# Patient Record
Sex: Female | Born: 1965 | Race: Black or African American | Hispanic: No | Marital: Single | Smoking: Never smoker
Health system: Southern US, Community
[De-identification: ages and names within clinical notes are randomized; demographics above are authoritative.]

## PROBLEM LIST (undated history)

## (undated) ENCOUNTER — Emergency Department (HOSPITAL_COMMUNITY): Admission: EM | Payer: No Typology Code available for payment source | Source: Home / Self Care

## (undated) DIAGNOSIS — I10 Essential (primary) hypertension: Secondary | ICD-10-CM

## (undated) HISTORY — PX: CHOLECYSTECTOMY: SHX55

---

## 1998-04-14 ENCOUNTER — Emergency Department (HOSPITAL_COMMUNITY): Admission: EM | Admit: 1998-04-14 | Discharge: 1998-04-14 | Payer: Self-pay | Admitting: Emergency Medicine

## 2001-12-12 ENCOUNTER — Emergency Department (HOSPITAL_COMMUNITY): Admission: EM | Admit: 2001-12-12 | Discharge: 2001-12-12 | Payer: Self-pay | Admitting: *Deleted

## 2002-08-29 ENCOUNTER — Encounter
Admission: RE | Admit: 2002-08-29 | Discharge: 2002-11-27 | Payer: Self-pay | Admitting: Physical Medicine & Rehabilitation

## 2002-09-29 ENCOUNTER — Emergency Department (HOSPITAL_COMMUNITY): Admission: EM | Admit: 2002-09-29 | Discharge: 2002-09-30 | Payer: Self-pay | Admitting: Emergency Medicine

## 2002-11-28 ENCOUNTER — Encounter
Admission: RE | Admit: 2002-11-28 | Discharge: 2003-02-26 | Payer: Self-pay | Admitting: Physical Medicine & Rehabilitation

## 2003-02-07 ENCOUNTER — Encounter: Payer: Self-pay | Admitting: Emergency Medicine

## 2003-02-07 ENCOUNTER — Emergency Department (HOSPITAL_COMMUNITY): Admission: EM | Admit: 2003-02-07 | Discharge: 2003-02-07 | Payer: Self-pay | Admitting: Emergency Medicine

## 2003-04-14 ENCOUNTER — Encounter
Admission: RE | Admit: 2003-04-14 | Discharge: 2003-07-13 | Payer: Self-pay | Admitting: Physical Medicine & Rehabilitation

## 2004-01-11 ENCOUNTER — Other Ambulatory Visit: Admission: RE | Admit: 2004-01-11 | Discharge: 2004-01-11 | Payer: Self-pay | Admitting: Internal Medicine

## 2004-01-18 ENCOUNTER — Emergency Department (HOSPITAL_COMMUNITY): Admission: EM | Admit: 2004-01-18 | Discharge: 2004-01-18 | Payer: Self-pay | Admitting: Emergency Medicine

## 2004-06-30 ENCOUNTER — Emergency Department (HOSPITAL_COMMUNITY): Admission: EM | Admit: 2004-06-30 | Discharge: 2004-06-30 | Payer: Self-pay | Admitting: Emergency Medicine

## 2004-07-17 ENCOUNTER — Inpatient Hospital Stay (HOSPITAL_COMMUNITY): Admission: AD | Admit: 2004-07-17 | Discharge: 2004-07-17 | Payer: Self-pay | Admitting: Internal Medicine

## 2004-07-29 ENCOUNTER — Inpatient Hospital Stay (HOSPITAL_COMMUNITY): Admission: AD | Admit: 2004-07-29 | Discharge: 2004-07-29 | Payer: Self-pay | Admitting: Family Medicine

## 2004-11-11 ENCOUNTER — Encounter: Admission: RE | Admit: 2004-11-11 | Discharge: 2004-12-05 | Payer: Self-pay | Admitting: Occupational Medicine

## 2004-12-27 ENCOUNTER — Encounter: Admission: RE | Admit: 2004-12-27 | Discharge: 2005-01-27 | Payer: Self-pay | Admitting: Occupational Medicine

## 2005-04-01 ENCOUNTER — Encounter: Admission: RE | Admit: 2005-04-01 | Discharge: 2005-04-01 | Payer: Self-pay | Admitting: Occupational Medicine

## 2005-05-02 ENCOUNTER — Emergency Department (HOSPITAL_COMMUNITY): Admission: EM | Admit: 2005-05-02 | Discharge: 2005-05-02 | Payer: Self-pay | Admitting: Emergency Medicine

## 2006-01-30 ENCOUNTER — Inpatient Hospital Stay (HOSPITAL_COMMUNITY): Admission: RE | Admit: 2006-01-30 | Discharge: 2006-02-02 | Payer: Self-pay | Admitting: Obstetrics

## 2006-01-30 ENCOUNTER — Ambulatory Visit: Payer: Self-pay | Admitting: *Deleted

## 2006-02-03 ENCOUNTER — Ambulatory Visit: Payer: Self-pay | Admitting: *Deleted

## 2006-02-06 ENCOUNTER — Ambulatory Visit: Payer: Self-pay | Admitting: Obstetrics & Gynecology

## 2006-02-10 ENCOUNTER — Ambulatory Visit: Payer: Self-pay | Admitting: *Deleted

## 2006-02-13 ENCOUNTER — Inpatient Hospital Stay (HOSPITAL_COMMUNITY): Admission: RE | Admit: 2006-02-13 | Discharge: 2006-02-16 | Payer: Self-pay | Admitting: Obstetrics

## 2006-04-08 ENCOUNTER — Ambulatory Visit (HOSPITAL_COMMUNITY): Admission: RE | Admit: 2006-04-08 | Discharge: 2006-04-08 | Payer: Self-pay | Admitting: Obstetrics

## 2006-04-24 ENCOUNTER — Encounter: Admission: RE | Admit: 2006-04-24 | Discharge: 2006-04-24 | Payer: Self-pay | Admitting: Obstetrics

## 2006-10-20 ENCOUNTER — Emergency Department (HOSPITAL_COMMUNITY): Admission: EM | Admit: 2006-10-20 | Discharge: 2006-10-20 | Payer: Self-pay | Admitting: Emergency Medicine

## 2007-12-31 ENCOUNTER — Other Ambulatory Visit: Admission: RE | Admit: 2007-12-31 | Discharge: 2007-12-31 | Payer: Self-pay | Admitting: Family Medicine

## 2008-05-13 ENCOUNTER — Emergency Department (HOSPITAL_COMMUNITY): Admission: EM | Admit: 2008-05-13 | Discharge: 2008-05-14 | Payer: Self-pay | Admitting: Emergency Medicine

## 2008-06-12 ENCOUNTER — Emergency Department (HOSPITAL_COMMUNITY): Admission: EM | Admit: 2008-06-12 | Discharge: 2008-06-12 | Payer: Self-pay | Admitting: Emergency Medicine

## 2009-03-13 ENCOUNTER — Other Ambulatory Visit: Admission: RE | Admit: 2009-03-13 | Discharge: 2009-03-13 | Payer: Self-pay | Admitting: Family Medicine

## 2009-07-20 ENCOUNTER — Encounter: Admission: RE | Admit: 2009-07-20 | Discharge: 2009-07-20 | Payer: Self-pay | Admitting: Family Medicine

## 2010-01-14 ENCOUNTER — Emergency Department (HOSPITAL_COMMUNITY): Admission: EM | Admit: 2010-01-14 | Discharge: 2010-01-14 | Payer: Self-pay | Admitting: Emergency Medicine

## 2010-03-26 ENCOUNTER — Other Ambulatory Visit: Admission: RE | Admit: 2010-03-26 | Discharge: 2010-03-26 | Payer: Self-pay | Admitting: Family Medicine

## 2011-02-12 ENCOUNTER — Emergency Department (HOSPITAL_COMMUNITY)
Admission: EM | Admit: 2011-02-12 | Discharge: 2011-02-12 | Disposition: A | Discharge status: Home / Self Care | Payer: No Typology Code available for payment source | Attending: Emergency Medicine | Admitting: Emergency Medicine

## 2011-02-12 DIAGNOSIS — I10 Essential (primary) hypertension: Secondary | ICD-10-CM | POA: Insufficient documentation

## 2011-02-12 DIAGNOSIS — M542 Cervicalgia: Secondary | ICD-10-CM | POA: Insufficient documentation

## 2011-02-12 DIAGNOSIS — M79609 Pain in unspecified limb: Secondary | ICD-10-CM | POA: Insufficient documentation

## 2011-03-24 ENCOUNTER — Emergency Department (HOSPITAL_COMMUNITY)
Admission: EM | Admit: 2011-03-24 | Discharge: 2011-03-24 | Disposition: A | Discharge status: Home / Self Care | Payer: No Typology Code available for payment source | Attending: Emergency Medicine | Admitting: Emergency Medicine

## 2011-03-24 ENCOUNTER — Emergency Department (HOSPITAL_COMMUNITY): Payer: No Typology Code available for payment source

## 2011-03-24 DIAGNOSIS — Y9289 Other specified places as the place of occurrence of the external cause: Secondary | ICD-10-CM | POA: Insufficient documentation

## 2011-03-24 DIAGNOSIS — M79609 Pain in unspecified limb: Secondary | ICD-10-CM | POA: Insufficient documentation

## 2011-03-24 DIAGNOSIS — I10 Essential (primary) hypertension: Secondary | ICD-10-CM | POA: Insufficient documentation

## 2011-03-26 ENCOUNTER — Emergency Department (HOSPITAL_COMMUNITY)
Admission: EM | Admit: 2011-03-26 | Discharge: 2011-03-26 | Disposition: A | Discharge status: Home / Self Care | Payer: No Typology Code available for payment source | Attending: Emergency Medicine | Admitting: Emergency Medicine

## 2011-03-26 DIAGNOSIS — I10 Essential (primary) hypertension: Secondary | ICD-10-CM | POA: Insufficient documentation

## 2011-03-26 DIAGNOSIS — M545 Low back pain, unspecified: Secondary | ICD-10-CM | POA: Insufficient documentation

## 2011-03-26 DIAGNOSIS — M79609 Pain in unspecified limb: Secondary | ICD-10-CM | POA: Insufficient documentation

## 2011-03-26 DIAGNOSIS — IMO0002 Reserved for concepts with insufficient information to code with codable children: Secondary | ICD-10-CM | POA: Insufficient documentation

## 2011-05-02 NOTE — Discharge Summary (Signed)
NAME:  LAVERA, VANDERMEER             ACCOUNT NO.:  [id number]   MEDICAL RECORD NO.:  [id number]          PATIENT TYPE:  INP   LOCATION:  9118                          FACILITY:  [facility]   PHYSICIAN:  Kathreen Cosier, M.D.DATE OF BIRTH:  09/09/1966   DATE OF ADMISSION:  02/13/2006  DATE OF DISCHARGE:  02/16/2006                                 DISCHARGE SUMMARY   Patient is a 45 year old gravida 5, para 2-1-1-3, [facility] February 14, 2006 to February 24, 2006.  Previous cesarean section in the past and patient desired repeat  cesarean section.  She also had a history of hypertension and was on Norvasc  10 mg p.o. daily.  She was followed with nonstress tests twice a week and  she had a positive GBS.  On admission she had a repeat low transverse  cesarean section of 7 pound 2 ounce female, Apgars 9 and 9.  Postoperatively  she did well.  Her hemoglobin on admission was 11.8, postoperative 9.5,  platelets 234 and 208.  PT/PTT normal.  Electrolytes normal.  Patient  discharged home on the third postoperative day, ambulatory, on a regular  diet to see me in six weeks.   DISCHARGE DIAGNOSES:  Status post repeat low transverse cesarean section at  term.           ______________________________  Kathreen Cosier, M.D.     BAM/MEDQ  D:  03/11/2006  T:  03/11/2006  Job:  [id number]

## 2011-05-02 NOTE — Op Note (Signed)
NAME:  Kendra Webb, Kendra Webb             ACCOUNT NO.:  [id number]   MEDICAL RECORD NO.:  [id number]          PATIENT TYPE:  INP   LOCATION:  9118                          FACILITY:  [facility]   PHYSICIAN:  Kathreen Cosier, M.D.DATE OF BIRTH:  December 02, 1966   DATE OF PROCEDURE:  02/13/2006  DATE OF DISCHARGE:                                 OPERATIVE REPORT   PREOPERATIVE DIAGNOSES:  1.  Previous cesarean section.  2.  At term, desires repeat.   SURGEON:  Kathreen Cosier, M.D.   ANESTHESIA:  Spinal.   DESCRIPTION OF PROCEDURE:  After spinal, was placed in the supine position  on the operating room table.  Transverse suprapubic incision made through  the old scar, carried down through the rectus fascia.  The fascia was  cleaned and incised the length of the incision.  Rectus muscle were  retracted laterally.  The peritoneum was incised longitudinally.  Transverse  incision made in the vesicouterine peritoneum above the bladder.  The  bladder flap mobilized inferiorly.  Transverse lower uterine incision made.  The patient was delivered from the OP position of a female, Apgars 9 and 9,  weight 7 pounds 1 ounce.  The team was in attendance.  Fluid was clear.  Placenta was anterior and removed manually.  Uterine cavity was cleaned with  dry laps.  Uterine incision closed in one layer with continuous suture of #1  chromic.  Hemostasis was satisfactory.  Bladder flap reattached with 2-0  chromic.  Uterus was well contracted.  Tubes and ovaries normal.  Abdomen  closed in layers.  Peritoneum with continuous suture of 0 chromic, fascia  with continuous suture of 0 Dexon and skin closed with subcuticular stitch  of 4-0 Monocryl.  Blood loss 500 mL.           ______________________________  Kathreen Cosier, M.D.     BAM/MEDQ  D:  02/13/2006  T:  02/14/2006  Job:  [id number]

## 2011-09-11 LAB — COMPREHENSIVE METABOLIC PANEL
Albumin: 4.1
Alkaline Phosphatase: 51
Calcium: 9.6
Chloride: 103
Creatinine, Ser: 0.78
GFR calc Af Amer: >60
Sodium: 138
Total Bilirubin: 1
Total Protein: 7.5

## 2011-09-11 LAB — CBC
Hemoglobin: 12.8
RDW: 12.9
WBC: 5.3

## 2011-09-11 LAB — DIFFERENTIAL
Lymphs Abs: 1.4
Monocytes Absolute: 0.4
Neutro Abs: 3.5
Neutrophils Relative %: 66

## 2011-09-11 LAB — POCT CARDIAC MARKERS
Myoglobin, poc: 52.5
Operator id: 231701

## 2013-05-19 ENCOUNTER — Emergency Department (INDEPENDENT_AMBULATORY_CARE_PROVIDER_SITE_OTHER)
Admission: EM | Admit: 2013-05-19 | Discharge: 2013-05-19 | Disposition: A | Discharge status: Home / Self Care | Payer: Self-pay | Source: Home / Self Care | Attending: Family Medicine | Admitting: Family Medicine

## 2013-05-19 ENCOUNTER — Encounter (HOSPITAL_COMMUNITY): Payer: Self-pay | Admitting: Emergency Medicine

## 2013-05-19 ENCOUNTER — Ambulatory Visit: Payer: Managed Care, Other (non HMO)

## 2013-05-19 ENCOUNTER — Emergency Department (INDEPENDENT_AMBULATORY_CARE_PROVIDER_SITE_OTHER): Payer: Self-pay

## 2013-05-19 DIAGNOSIS — M25461 Effusion, right knee: Secondary | ICD-10-CM

## 2013-05-19 DIAGNOSIS — M25469 Effusion, unspecified knee: Secondary | ICD-10-CM

## 2013-05-19 HISTORY — DX: Essential (primary) hypertension: I10

## 2013-05-19 MED ORDER — CELECOXIB 100 MG PO CAPS: 100.0000 mg | ORAL_CAPSULE | Freq: Two times a day (BID) | ORAL | Status: DC

## 2013-05-19 MED ORDER — TRAMADOL HCL 50 MG PO TABS: 50.0000 mg | ORAL_TABLET | Freq: Three times a day (TID) | ORAL | Status: DC | PRN

## 2013-05-19 NOTE — ED Notes (Signed)
Pt c/o right knee inj onset Tuesday Reports she was repositioning a pt that was already on her bed Felt something "pulling"... Denies: swelling She is alert and oriented and ambulated well to exam room

## 2013-05-21 NOTE — ED Provider Notes (Signed)
History     CSN: [id number]  Arrival date & time 05/19/13  1842   First MD Initiated Contact with Patient 05/19/13 2012      Chief Complaint  Patient presents with  . Knee Injury    (Consider location/radiation/quality/duration/timing/severity/associated sxs/prior treatment) HPI Comments: 47 y/o female here c/o right knee pain and swelling for 3 days. 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 Patient states she was lifting a patient that was laying in bed while she was standing from behind the head board her right knee was in contact with the headboard during this process when she felt a sudden painful "pop" followed by pulling sensation. She has continued to bear weight on the right leg with worsening discomfort in her right knee during walking today swelling worse and is painful to bend her knee.    Past Medical History  Diagnosis Date  . Hypertension     Past Surgical History  Procedure Laterality Date  . Cholecystectomy      No family history on file.  History  Substance Use Topics  . Smoking status: Never Smoker   . Smokeless tobacco: Not on file  . Alcohol Use: No    OB History   Grav Para Term Preterm Abortions TAB SAB Ect Mult Living                  Review of Systems  Constitutional: Negative for fever and chills.  HENT: Negative for sore throat.   Gastrointestinal: Negative for nausea.  Musculoskeletal:       Right knee pain as per HPI  Skin: Negative for rash and wound.  All other systems reviewed and are negative.    Allergies  Hydrocodone-acetaminophen; Latex; and Pantoprazole sodium  Home Medications   Current Outpatient Rx  Name  Route  Sig  Dispense  Refill  . celecoxib (CELEBREX) 100 MG capsule   Oral   Take 1 capsule (100 mg total) by mouth 2 (two) times daily.   20 capsule   0   . LISINOPRIL PO   Oral   Take by mouth.         . traMADol  (ULTRAM) 50 MG tablet   Oral   Take 1 tablet (50 mg total) by mouth every 8 (eight) hours as needed for pain.   20 tablet   0     BP 135/76  Pulse 51  Temp(Src) 98 F (36.7 C) (Oral)  Resp 16  SpO2 100%  LMP 04/29/2013  Physical Exam  Nursing note and vitals reviewed. Constitutional: She is oriented to person, place, and time. She appears well-developed and well-nourished. No distress.  HENT:  Head: Normocephalic and atraumatic.  Mouth/Throat: No oropharyngeal exudate.  Cardiovascular: Normal heart sounds.   Pulmonary/Chest: Breath sounds normal.  Musculoskeletal:  Right knee: mild swelling otherwise no obvious deformity. No erythema or increased temperature. Tenderness with palpation over patella worse lateral to the patella. Patella appears well aligned. There is moderate knee effusion. No crepitus. Limited flexion due to pain and swelling. Negative drawer test. Weight bearing. Entire right lower leg appears neurovascularly intact.  Lymphadenopathy:    She has no cervical adenopathy.  Neurological: She is alert and oriented to person, place, and time.  Skin: She is not diaphoretic.    ED Course  Procedures (including critical care time)  Labs Reviewed - No data to display Dg Knee Complete 4  Views Right  05/19/2013   *RADIOLOGY REPORT*  Clinical Data: Knee pain.  Knee injury.  RIGHT KNEE - COMPLETE 4+ VIEW  Comparison: None.  Findings: Small to moderate effusion is present.  Joint spaces are preserved.  No fracture.  Anatomic alignment.  IMPRESSION: Small to moderate effusion without osseous injury.   Original Report Authenticated By: Andreas Newport, M.D.     1. Knee effusion, right       MDM  Placed on knee immobilizer and crutches.  Prescribed celebrex and tramadol.  Orthopedic referral.  Patient also will follow up with occupational health.        Sharin Grave, MD 05/21/13 7829

## 2016-02-25 ENCOUNTER — Emergency Department (HOSPITAL_COMMUNITY)
Admission: EM | Admit: 2016-02-25 | Discharge: 2016-02-26 | Disposition: A | Discharge status: Home / Self Care | Payer: No Typology Code available for payment source | Attending: Emergency Medicine | Admitting: Emergency Medicine

## 2016-02-25 ENCOUNTER — Encounter (HOSPITAL_COMMUNITY): Payer: Self-pay | Admitting: Emergency Medicine

## 2016-02-25 ENCOUNTER — Emergency Department (HOSPITAL_COMMUNITY): Payer: No Typology Code available for payment source

## 2016-02-25 DIAGNOSIS — S29002A Unspecified injury of muscle and tendon of back wall of thorax, initial encounter: Secondary | ICD-10-CM | POA: Diagnosis not present

## 2016-02-25 DIAGNOSIS — I10 Essential (primary) hypertension: Secondary | ICD-10-CM | POA: Insufficient documentation

## 2016-02-25 DIAGNOSIS — Z79899 Other long term (current) drug therapy: Secondary | ICD-10-CM | POA: Insufficient documentation

## 2016-02-25 DIAGNOSIS — Z7982 Long term (current) use of aspirin: Secondary | ICD-10-CM | POA: Diagnosis not present

## 2016-02-25 DIAGNOSIS — S199XXA Unspecified injury of neck, initial encounter: Secondary | ICD-10-CM | POA: Insufficient documentation

## 2016-02-25 DIAGNOSIS — M546 Pain in thoracic spine: Secondary | ICD-10-CM

## 2016-02-25 DIAGNOSIS — S8012XA Contusion of left lower leg, initial encounter: Secondary | ICD-10-CM | POA: Diagnosis not present

## 2016-02-25 DIAGNOSIS — S0990XA Unspecified injury of head, initial encounter: Secondary | ICD-10-CM | POA: Insufficient documentation

## 2016-02-25 DIAGNOSIS — Y9389 Activity, other specified: Secondary | ICD-10-CM | POA: Diagnosis not present

## 2016-02-25 DIAGNOSIS — S8011XA Contusion of right lower leg, initial encounter: Secondary | ICD-10-CM | POA: Diagnosis not present

## 2016-02-25 DIAGNOSIS — Y92410 Unspecified street and highway as the place of occurrence of the external cause: Secondary | ICD-10-CM | POA: Insufficient documentation

## 2016-02-25 DIAGNOSIS — S8992XA Unspecified injury of left lower leg, initial encounter: Secondary | ICD-10-CM | POA: Diagnosis present

## 2016-02-25 DIAGNOSIS — Y998 Other external cause status: Secondary | ICD-10-CM | POA: Diagnosis not present

## 2016-02-25 DIAGNOSIS — Z91040 Latex allergy status: Secondary | ICD-10-CM | POA: Insufficient documentation

## 2016-02-25 LAB — CBC
HEMATOCRIT: 38.8 % (ref 36.0–46.0)
HEMOGLOBIN: 12.8 g/dL (ref 12.0–15.0)
MCH: 29.7 pg (ref 26.0–34.0)
MCHC: 33 g/dL (ref 30.0–36.0)
MCV: 90 fL (ref 78.0–100.0)
Platelets: 291 10*3/uL (ref 150–400)
RBC: 4.31 MIL/uL (ref 3.87–5.11)
RDW: 12.3 % (ref 11.5–15.5)
WBC: 8.1 10*3/uL (ref 4.0–10.5)

## 2016-02-25 LAB — I-STAT TROPONIN, ED: Troponin i, poc: 0 ng/mL (ref 0.00–0.08)

## 2016-02-25 LAB — BASIC METABOLIC PANEL
Anion gap: 8 (ref 5–15)
BUN: 16 mg/dL (ref 6–20)
CALCIUM: 9.9 mg/dL (ref 8.9–10.3)
CHLORIDE: 107 mmol/L (ref 101–111)
CO2: 27 mmol/L (ref 22–32)
CREATININE: 0.96 mg/dL (ref 0.44–1.00)
GFR calc Af Amer: >60 mL/min (ref >60)
GLUCOSE: 99 mg/dL (ref 65–99)
Potassium: 4.1 mmol/L (ref 3.5–5.1)
Sodium: 142 mmol/L (ref 135–145)

## 2016-02-25 MED ORDER — OXYCODONE-ACETAMINOPHEN 5-325 MG PO TABS
1.0000 | ORAL_TABLET | Freq: Once | ORAL | Status: DC
  Filled 2016-02-25: qty 1

## 2016-02-25 MED ORDER — ONDANSETRON 8 MG PO TBDP
8.0000 mg | ORAL_TABLET | Freq: Once | ORAL | Status: DC
  Filled 2016-02-25: qty 1

## 2016-02-25 MED ORDER — NAPROXEN 500 MG PO TABS: 500.0000 mg | ORAL_TABLET | Freq: Two times a day (BID) | ORAL | Status: DC

## 2016-02-25 MED ORDER — METHOCARBAMOL 500 MG PO TABS: 500.0000 mg | ORAL_TABLET | Freq: Two times a day (BID) | ORAL | Status: DC

## 2016-02-25 MED ORDER — TRAMADOL HCL 50 MG PO TABS: 50.0000 mg | ORAL_TABLET | Freq: Four times a day (QID) | ORAL | Status: AC | PRN

## 2016-02-25 MED ORDER — IBUPROFEN 800 MG PO TABS
800.0000 mg | ORAL_TABLET | Freq: Once | ORAL | Status: AC
  Administered 2016-02-25: 800 mg via ORAL
  Filled 2016-02-25: qty 1
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
## 2016-02-25 NOTE — ED Notes (Signed)
Patient transported to X-ray 

## 2016-02-25 NOTE — ED Notes (Signed)
CALL PATIENT NAME SEVERAL TIME TO COLLECT LABS AND NO ANSWER
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
## 2016-05-10 ENCOUNTER — Encounter (HOSPITAL_COMMUNITY): Payer: Self-pay | Admitting: *Deleted

## 2016-05-10 ENCOUNTER — Emergency Department (HOSPITAL_COMMUNITY)
Admission: EM | Admit: 2016-05-10 | Discharge: 2016-05-10 | Disposition: A | Discharge status: Home / Self Care | Payer: Worker's Compensation | Attending: Emergency Medicine | Admitting: Emergency Medicine

## 2016-05-10 ENCOUNTER — Emergency Department (HOSPITAL_COMMUNITY): Payer: Worker's Compensation

## 2016-05-10 DIAGNOSIS — Z91040 Latex allergy status: Secondary | ICD-10-CM | POA: Diagnosis not present

## 2016-05-10 DIAGNOSIS — S63502A Unspecified sprain of left wrist, initial encounter: Secondary | ICD-10-CM | POA: Diagnosis not present

## 2016-05-10 DIAGNOSIS — I10 Essential (primary) hypertension: Secondary | ICD-10-CM | POA: Insufficient documentation

## 2016-05-10 DIAGNOSIS — Z7982 Long term (current) use of aspirin: Secondary | ICD-10-CM | POA: Diagnosis not present

## 2016-05-10 DIAGNOSIS — Z791 Long term (current) use of non-steroidal anti-inflammatories (NSAID): Secondary | ICD-10-CM | POA: Diagnosis not present

## 2016-05-10 DIAGNOSIS — Y990 Civilian activity done for income or pay: Secondary | ICD-10-CM | POA: Diagnosis not present

## 2016-05-10 DIAGNOSIS — X58XXXA Exposure to other specified factors, initial encounter: Secondary | ICD-10-CM | POA: Diagnosis not present

## 2016-05-10 DIAGNOSIS — Y93F2 Activity, caregiving, lifting: Secondary | ICD-10-CM | POA: Diagnosis not present

## 2016-05-10 DIAGNOSIS — Z79899 Other long term (current) drug therapy: Secondary | ICD-10-CM | POA: Diagnosis not present

## 2016-05-10 DIAGNOSIS — Y929 Unspecified place or not applicable: Secondary | ICD-10-CM | POA: Insufficient documentation

## 2016-05-10 DIAGNOSIS — S6992XA Unspecified injury of left wrist, hand and finger(s), initial encounter: Secondary | ICD-10-CM | POA: Diagnosis present

## 2016-05-10 MED ORDER — IBUPROFEN 600 MG PO TABS: 600.0000 mg | ORAL_TABLET | Freq: Four times a day (QID) | ORAL | Status: DC | PRN

## 2016-05-10 NOTE — ED Provider Notes (Signed)
CSN: [id number]     Arrival date & time 05/10/16  0848 History  By signing my name below, I, Kendra Webb, attest that this documentation has been prepared under the direction and in the presence of Langston MaskerKaren Reford Olliff, [place]. Electronically Signed: Phillis HaggisGabriella Webb, ED Scribe. 05/10/2016. 9:16 AM.   Chief Complaint  Patient presents with  . Wrist Pain   The history is provided by the patient. No language interpreter was used.  HPI Comments: Eichholz CheRachel R Webb is a 50 y.o. Female with a hx of HTN who presents to the Emergency Department complaining of left wrist pain onset one day ago. Pt reports hurting her wrist after assisting a pt who had passed out on the floor. She reports worsening pain with movement, but states pain is constant even at rest. The pain radiates up to her mid-forearm. She has not taken anything for her pain. She denies numbness, weakness, or joint swelling. Pt states she cannot take Vicodin.   Past Medical History  Diagnosis Date  . Hypertension    Past Surgical History  Procedure Laterality Date  . Cholecystectomy     History reviewed. No pertinent family history. Social History  Substance Use Topics  . Smoking status: Never Smoker   . Smokeless tobacco: None  . Alcohol Use: No   OB History    No data available     Review of Systems  Musculoskeletal: Positive for arthralgias. Negative for joint swelling.  Neurological: Negative for weakness and numbness.  All other systems reviewed and are negative.  Allergies  Hydrocodone-acetaminophen; Latex; and Pantoprazole sodium  Home Medications   Prior to Admission medications   Medication Sig Start Date End Date Taking? Authorizing Provider  aspirin 325 MG tablet Take 325 mg by mouth every 6 (six) hours as needed for mild pain, moderate pain or headache.    Historical Provider, MD  lisinopril (PRINIVIL,ZESTRIL) 10 MG tablet Take 10 mg by mouth daily.    Historical Provider, MD  methocarbamol (ROBAXIN) 500 MG tablet  Take 1 tablet (500 mg total) by mouth 2 (two) times daily. 02/25/16   Tatyana Kirichenko, PA-C  naproxen (NAPROSYN) 500 MG tablet Take 1 tablet (500 mg total) by mouth 2 (two) times daily. 02/25/16   Tatyana Kirichenko, PA-C  traMADol (ULTRAM) 50 MG tablet Take 1 tablet (50 mg total) by mouth every 6 (six) hours as needed. 02/25/16   Tatyana Kirichenko, PA-C   BP 113/73 mmHg  Pulse 62  Temp(Src) 98.6 F (37 C) (Oral)  Resp 16  Ht 5\' 4"  (1.626 m)  Wt 150 lb (68.04 kg)  BMI 25.73 kg/m2  SpO2 99%  LMP 04/29/2013 Physical Exam  Constitutional: She is oriented to person, place, and time. She appears well-developed and well-nourished.  HENT:  Head: Normocephalic and atraumatic.  Eyes: Conjunctivae are normal.  Neck: Normal range of motion. Neck supple.  Cardiovascular: Normal rate and regular rhythm.   Pulmonary/Chest: Effort normal and breath sounds normal.  Musculoskeletal: Normal range of motion. She exhibits tenderness.  Left wrist: tenderness diffusely; full ROM  Neurological: She is alert and oriented to person, place, and time.  Skin: Skin is warm and dry.  Psychiatric: She has a normal mood and affect. Her behavior is normal.  Nursing note and vitals reviewed.  ED Course  Procedures (including critical care time) DIAGNOSTIC STUDIES: Oxygen Saturation is 99% on RA, normal by my interpretation.    COORDINATION OF CARE: 9:11 AM-Discussed treatment plan which includes x-ray with pt at bedside and pt  agreed to plan.    Labs Review Labs Reviewed - No data to display  Imaging Review Dg Wrist Complete Left  05/10/2016  CLINICAL DATA:  Acute left wrist pain after injury at work. Initial encounter. EXAM: LEFT WRIST - COMPLETE 3+ VIEW COMPARISON:  None. FINDINGS: There is no evidence of fracture or dislocation. Narrowing and osteophyte formation is seen involving the first carpometacarpal joint. Soft tissues are unremarkable. IMPRESSION: Osteoarthritis of the first carpometacarpal  joint. No acute abnormality seen in the left wrist. Electronically Signed   By: Lupita Raider, M.D.   On: 05/10/2016 10:00   I have personally reviewed and evaluated these images and lab results as part of my medical decision-making.   EKG Interpretation None      MDM   Final diagnoses:  Wrist sprain, left, initial encounter   Wrist sprain An After Visit Summary was printed and given to the patient.  Kendra Areas, PA-C 05/10/16 1015  [address] [place], PA-C 05/10/16 1015  Tilden Fossa, MD 05/11/16 [phone]

## 2016-05-10 NOTE — Discharge Instructions (Signed)
Wrist Pain There are many things that can cause wrist pain. Some common causes include:  An injury to the wrist area, such as a sprain, strain, or fracture.  Overuse of the joint.  A condition that causes increased pressure on a nerve in the wrist (carpal tunnel syndrome).  Wear and tear of the joints that occurs with aging (osteoarthritis).  A variety of other types of arthritis. Sometimes, the cause of wrist pain is not known. The pain often goes away when you follow your health care provider's instructions for relieving pain at home. If your wrist pain continues, tests may need to be done to diagnose your condition. HOME CARE INSTRUCTIONS Pay attention to any changes in your symptoms. Take these actions to help with your pain:  Rest the wrist area for at least 48 hours or as told by your health care provider.  If directed, apply ice to the injured area:  Put ice in a plastic bag.  Place a towel between your skin and the bag.  Leave the ice on for 20 minutes, 2-3 times per day.  Keep your arm raised (elevated) above the level of your heart while you are sitting or lying down.  If a splint or elastic bandage has been applied, use it as told by your health care provider.  Remove the splint or bandage only as told by your health care provider.  Loosen the splint or bandage if your fingers become numb or have a tingling feeling, or if they turn cold or blue.  Take over-the-counter and prescription medicines only as told by your health care provider.  Keep all follow-up visits as told by your health care provider. This is important. SEEK MEDICAL CARE IF:  Your pain is not helped by treatment.  Your pain gets worse. SEEK IMMEDIATE MEDICAL CARE IF:  Your fingers become swollen.  Your fingers turn white, very red, or cold and blue.  Your fingers are numb or have a tingling feeling.  You have difficulty moving your fingers.   This information is not intended to replace  advice given to you by your health care provider. Make sure you discuss any questions you have with your health care provider.   Document Released: 09/10/2005 Document Revised: 08/22/2015 Document Reviewed: 04/18/2015 Elsevier Interactive Patient Education 2016 Elsevier Inc.  

## 2016-05-10 NOTE — ED Notes (Signed)
Declined W/C at D/C and was escorted to lobby by RN. 

## 2016-05-10 NOTE — ED Notes (Signed)
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 Pt reports that she had to assist her Pt to the floor after passing out.

## 2017-08-17 ENCOUNTER — Encounter (HOSPITAL_COMMUNITY): Payer: Self-pay | Admitting: Emergency Medicine

## 2017-08-17 ENCOUNTER — Emergency Department (HOSPITAL_COMMUNITY)
Admission: EM | Admit: 2017-08-17 | Discharge: 2017-08-17 | Disposition: A | Discharge status: Home / Self Care | Payer: PRIVATE HEALTH INSURANCE | Attending: Emergency Medicine | Admitting: Emergency Medicine

## 2017-08-17 DIAGNOSIS — I160 Hypertensive urgency: Secondary | ICD-10-CM | POA: Insufficient documentation

## 2017-08-17 DIAGNOSIS — Z79899 Other long term (current) drug therapy: Secondary | ICD-10-CM | POA: Diagnosis not present

## 2017-08-17 DIAGNOSIS — Z7982 Long term (current) use of aspirin: Secondary | ICD-10-CM | POA: Insufficient documentation

## 2017-08-17 DIAGNOSIS — Z91040 Latex allergy status: Secondary | ICD-10-CM | POA: Insufficient documentation

## 2017-08-17 DIAGNOSIS — R42 Dizziness and giddiness: Secondary | ICD-10-CM | POA: Diagnosis present

## 2017-08-17 LAB — CBC WITH DIFFERENTIAL/PLATELET
BASOS ABS: 0 10*3/uL (ref 0.0–0.1)
BASOS PCT: 0 %
Eosinophils Absolute: 0 10*3/uL (ref 0.0–0.7)
Eosinophils Relative: 1 %
HEMATOCRIT: 38 % (ref 36.0–46.0)
HEMOGLOBIN: 12.5 g/dL (ref 12.0–15.0)
LYMPHS PCT: 31 %
Lymphs Abs: 1.6 10*3/uL (ref 0.7–4.0)
MCH: 28.5 pg (ref 26.0–34.0)
MCHC: 32.9 g/dL (ref 30.0–36.0)
MCV: 86.8 fL (ref 78.0–100.0)
MONO ABS: 0.4 10*3/uL (ref 0.1–1.0)
Monocytes Relative: 8 %
NEUTROS ABS: 3.1 10*3/uL (ref 1.7–7.7)
NEUTROS PCT: 60 %
Platelets: 255 10*3/uL (ref 150–400)
RBC: 4.38 MIL/uL (ref 3.87–5.11)
RDW: 12.2 % (ref 11.5–15.5)
WBC: 5.1 10*3/uL (ref 4.0–10.5)

## 2017-08-17 LAB — BASIC METABOLIC PANEL
ANION GAP: 8 (ref 5–15)
BUN: 12 mg/dL (ref 6–20)
CALCIUM: 9.2 mg/dL (ref 8.9–10.3)
CO2: 23 mmol/L (ref 22–32)
Chloride: 107 mmol/L (ref 101–111)
Creatinine, Ser: 0.77 mg/dL (ref 0.44–1.00)
GLUCOSE: 109 mg/dL — AB (ref 65–99)
POTASSIUM: 3.8 mmol/L (ref 3.5–5.1)
Sodium: 138 mmol/L (ref 135–145)

## 2017-08-17 LAB — URINALYSIS, ROUTINE W REFLEX MICROSCOPIC
BACTERIA UA: NONE SEEN
BILIRUBIN URINE: NEGATIVE
Glucose, UA: NEGATIVE mg/dL
KETONES UR: NEGATIVE mg/dL
LEUKOCYTES UA: NEGATIVE
NITRITE: NEGATIVE
PROTEIN: NEGATIVE mg/dL
Specific Gravity, Urine: 1.011 (ref 1.005–1.030)
pH: 5 (ref 5.0–8.0)

## 2017-08-17 MED ORDER — AMLODIPINE BESY-BENAZEPRIL HCL 10-20 MG PO CAPS: 1.0000 | ORAL_CAPSULE | Freq: Every day | ORAL | 1 refills | Status: AC

## 2017-08-17 NOTE — ED Notes (Signed)
Pt present today feeling bad secondary to her blood pressure medicines being changed 2 months ago.

## 2017-08-17 NOTE — ED Triage Notes (Signed)
Reports that physician changed blood pressure around.  States it isn't working as well and it makes me feel really bad.  Reporting a headache and neck stiffness at times.  C/o ha at this time.

## 2017-08-17 NOTE — ED Provider Notes (Signed)
[facility]-EMERGENCY DEPT Provider Note   CSN: [id number] Arrival date & time: 08/17/17  0500     History   Chief Complaint Chief Complaint  Patient presents with  . wants bp med changed    HPI Kendra Webb is a 51 y.o. female.  Patient is a 51 year old female with past medical history of hypertension. She presents for evaluation of elevated blood pressure, intermittent dizziness, and not feeling well for the past several weeks. She was initially taking lisinopril, then this was changed to Lotrel. During her last medication renewal, she was put back on lisinopril for unknown reasons. The patient feels as though this was likely a mistake and blames this medication for her not feeling well. Eyes any chest pain or shortness of breath. She denies any fevers or chills.   The history is provided by the patient.    Past Medical History:  Diagnosis Date  . Hypertension     There are no active problems to display for this patient.   Past Surgical History:  Procedure Laterality Date  . CHOLECYSTECTOMY      OB History    No data available       Home Medications    Prior to Admission medications   Medication Sig Start Date End Date Taking? Authorizing Provider  aspirin 325 MG tablet Take 325 mg by mouth every 6 (six) hours as needed for mild pain, moderate pain or headache.    [provider]  ibuprofen (ADVIL,MOTRIN) 600 MG tablet Take 1 tablet (600 mg total) by mouth every 6 (six) hours as needed. 05/10/16   Elson AreasSofia, Leslie K, PA-C  lisinopril (PRINIVIL,ZESTRIL) 10 MG tablet Take 10 mg by mouth daily.    [provider]  methocarbamol (ROBAXIN) 500 MG tablet Take 1 tablet (500 mg total) by mouth 2 (two) times daily. 02/25/16   Kirichenko, Tatyana, PA-C  naproxen (NAPROSYN) 500 MG tablet Take 1 tablet (500 mg total) by mouth 2 (two) times daily. 02/25/16   Kirichenko, Tatyana, PA-C  traMADol (ULTRAM) 50 MG tablet Take 1 tablet (50 mg total) by mouth every 6 (six)  hours as needed. 02/25/16   Jaynie CrumbleKirichenko, Tatyana, PA-C    Family History No family history on file.  Social History Social History  Substance Use Topics  . Smoking status: Never Smoker  . Smokeless tobacco: Never Used  . Alcohol use No     Allergies   Hydrocodone-acetaminophen; Latex; and Pantoprazole sodium   Review of Systems Review of Systems  All other systems reviewed and are negative.    Physical Exam Updated Vital Signs BP (!) 163/99 (BP Location: Right Arm)   Pulse (!) 52   Temp 97.9 F (36.6 C) (Oral)   Resp 18   LMP 04/29/2013   SpO2 100%   Physical Exam  Constitutional: She is oriented to person, place, and time. She appears well-developed and well-nourished. No distress.  HENT:  Head: Normocephalic and atraumatic.  Eyes: Pupils are equal, round, and reactive to light. EOM are normal.  Neck: Normal range of motion. Neck supple.  Cardiovascular: Normal rate and regular rhythm.  Exam reveals no gallop and no friction rub.   No murmur heard. Pulmonary/Chest: Effort normal and breath sounds normal. No respiratory distress. She has no wheezes.  Abdominal: Soft. Bowel sounds are normal. She exhibits no distension. There is no tenderness.  Musculoskeletal: Normal range of motion.  Neurological: She is alert and oriented to person, place, and time. No cranial nerve deficit. She exhibits normal  muscle tone. Coordination normal.  Skin: Skin is warm and dry. She is not diaphoretic.  Nursing note and vitals reviewed.    ED Treatments / Results  Labs (all labs ordered are listed, but only abnormal results are displayed) Labs Reviewed  BASIC METABOLIC PANEL  CBC WITH DIFFERENTIAL/PLATELET  URINALYSIS, ROUTINE W REFLEX MICROSCOPIC    EKG  EKG Interpretation  Date/Time:  Monday August 17 2017 06:08:42 EDT Ventricular Rate:  59 PR Interval:    QRS Duration: 86 QT Interval:  431 QTC Calculation: 427 R Axis:   73 Text Interpretation:  Sinus rhythm  Prolonged PR interval RSR' in V1 or V2, right VCD or RVH Confirmed by Geoffery Lyons ([id number]) on 08/17/2017 6:51:49 AM       Radiology No results found.  Procedures Procedures (including critical care time)  Medications Ordered in ED Medications - No data to display   Initial Impression / Assessment and Plan / ED Course  I have reviewed the triage vital signs and the nursing notes.  Pertinent labs & imaging results that were available during my care of the patient were reviewed by me and considered in my medical decision making (see chart for details).  Patient presents with elevated blood pressure and not feeling well since her blood pressure medication was changed from Lotrel to lisinopril. Her blood pressure is elevated here, however not to an emergent level.  Laboratory studies are reassuring, EKG is unchanged, and urinalysis is clear. She will be discharged, to return as needed for any problems.  Final Clinical Impressions(s) / ED Diagnoses   Final diagnoses:  None    New Prescriptions New Prescriptions   No medications on file     Geoffery Lyons, MD 08/17/17 [phone]

## 2017-08-17 NOTE — Discharge Instructions (Signed)
Stop taking lisinopril.  Begin taking Lotrel as prescribed.  Follow-up with your primary Dr. for a blood pressure check in the next week, and return to the ER if symptoms significantly worsen or change in the meantime.

## 2017-10-01 IMAGING — DX DG WRIST COMPLETE 3+V*L*
4 series · 4 of 4 positions shown · non-contrast
Comparison: None.

CLINICAL DATA: Acute left wrist pain after injury at work. Initial
encounter.

EXAM:
LEFT WRIST - COMPLETE 3+ VIEW

[wrist pa]
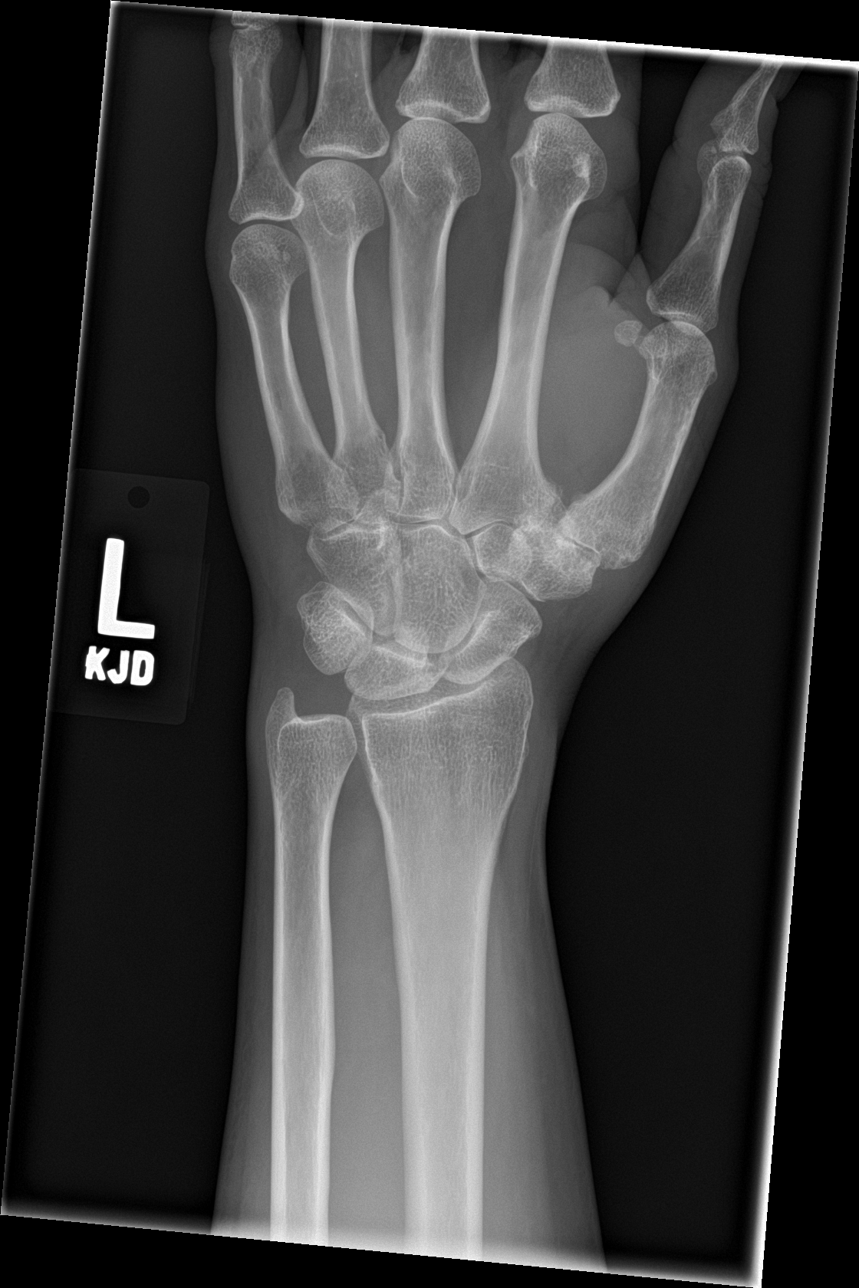

[wrist obl]
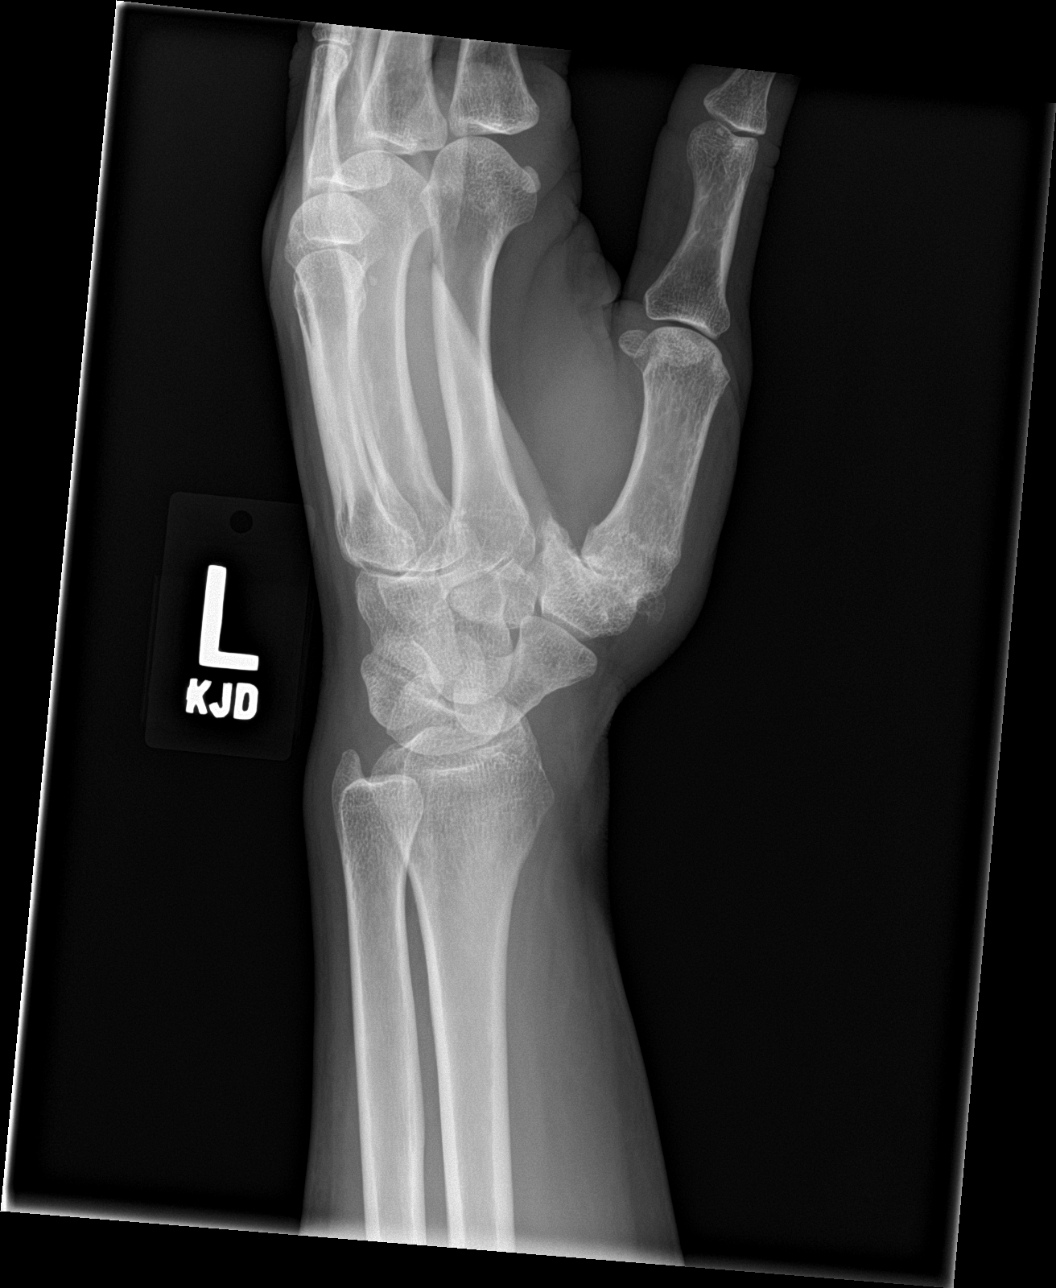

[wrist lat]
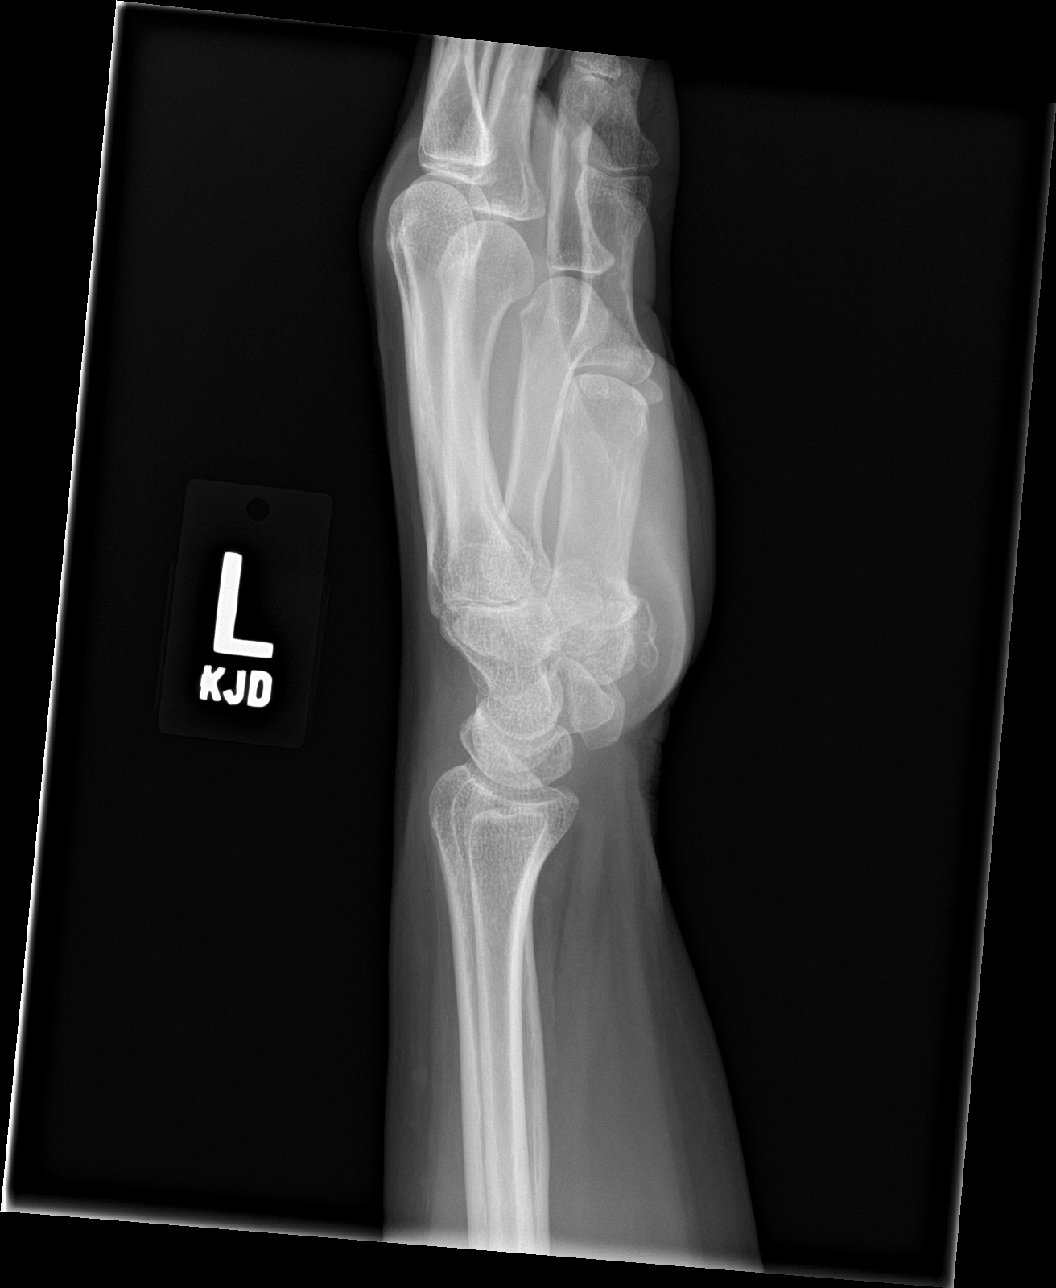

[wrist navicular]
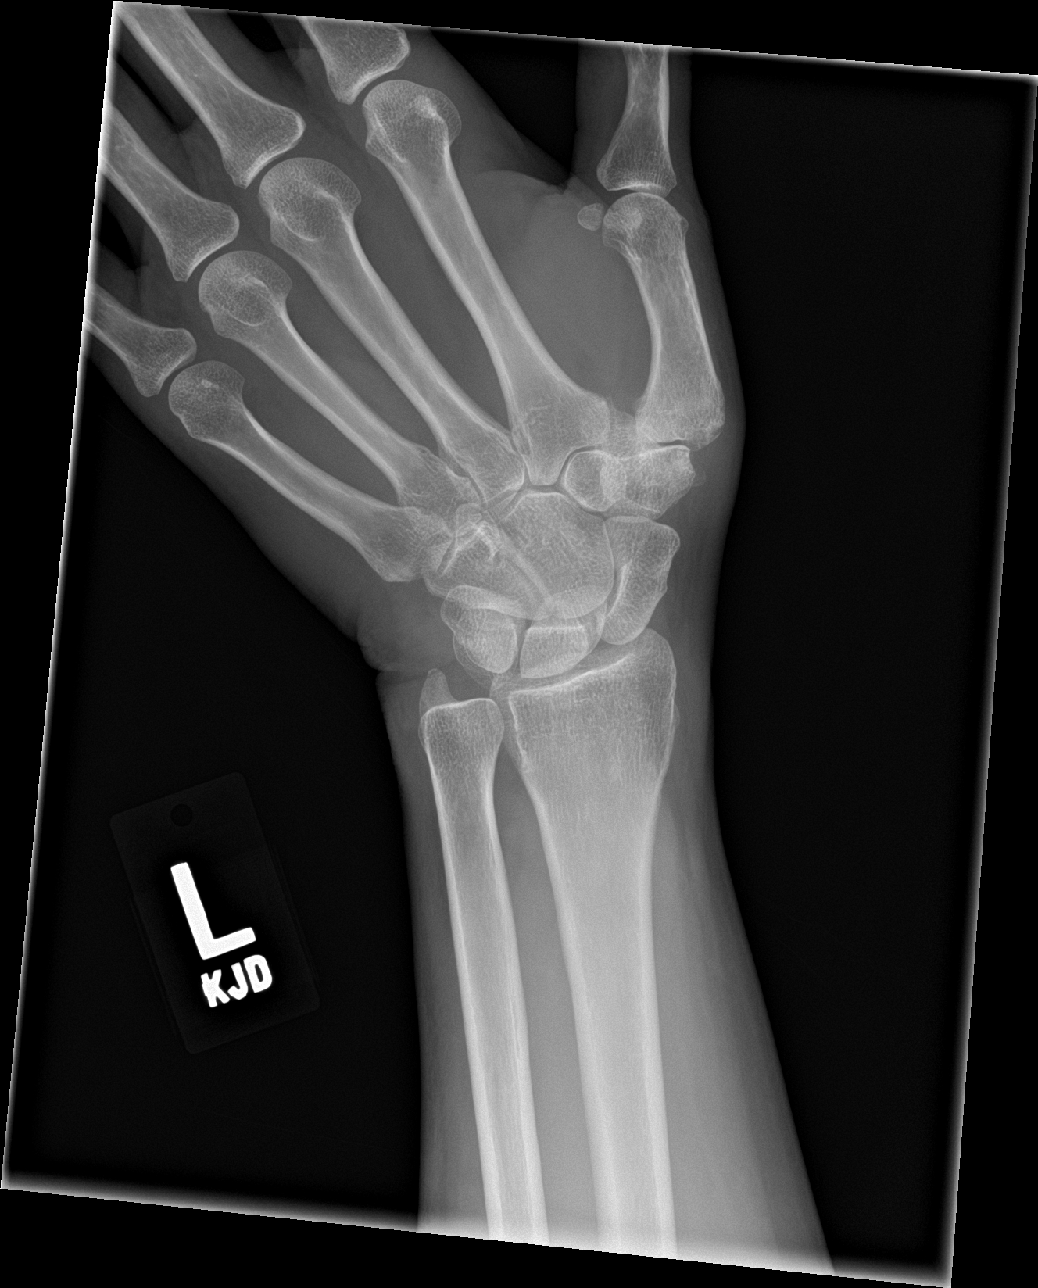

[4 of 4 positions shown; findings below may reference images not displayed]

FINDINGS: There is no evidence of fracture or dislocation. Narrowing and
osteophyte formation is seen involving the first carpometacarpal
joint. Soft tissues are unremarkable.
IMPRESSION: Osteoarthritis of the first carpometacarpal joint. No acute
abnormality seen in the left wrist.

## 2019-02-24 ENCOUNTER — Ambulatory Visit: Payer: Managed Care, Other (non HMO) | Admitting: Family Medicine

## 2022-12-01 ENCOUNTER — Emergency Department (HOSPITAL_COMMUNITY)
Admission: EM | Admit: 2022-12-01 | Discharge: 2022-12-01 | Discharge status: Against Medical Advice | Payer: BLUE CROSS/BLUE SHIELD | Attending: Emergency Medicine | Admitting: Emergency Medicine

## 2022-12-01 DIAGNOSIS — R030 Elevated blood-pressure reading, without diagnosis of hypertension: Secondary | ICD-10-CM | POA: Insufficient documentation

## 2022-12-01 DIAGNOSIS — Z5321 Procedure and treatment not carried out due to patient leaving prior to being seen by health care provider: Secondary | ICD-10-CM | POA: Diagnosis not present

## 2022-12-01 DIAGNOSIS — R002 Palpitations: Secondary | ICD-10-CM | POA: Diagnosis not present

## 2022-12-01 NOTE — ED Provider Triage Note (Signed)
Emergency Medicine Provider Triage Evaluation Note  Kendra Webb , a 56 y.o. female  was evaluated in triage.  Pt complains of high blood pressure.  Patient reports she began to take atorvastatin yesterday then she checked her blood pressure throughout the day and it was found to be elevated.  Patient also reports burning sensation in her arms.  No headache, vision changes, fever, chest pain, shortness of breath, bowel changes, urinary symptoms, rash.  Review of Systems  Positive: As above Negative: As above    Physical Exam  BP 109/75   Pulse (!) 56   Temp 97.8 F (36.6 C) (Oral)   Resp 17   LMP 04/29/2013   SpO2 99%  Gen:   Awake, no distress   Resp:  Normal effort  MSK:   Moves extremities without difficulty  Other:    Medical Decision Making  Medically screening exam initiated at 11:06 AM.  Appropriate orders placed.  Kendra Webb was informed that the remainder of the evaluation will be completed by another provider, this initial triage assessment does not replace that evaluation, and the importance of remaining in the ED until their evaluation is complete.     Kendra Webb, [place] 12/01/22 2002

## 2022-12-01 NOTE — ED Triage Notes (Signed)
Pt reports burning sensation in her arm, back and legs that started 3 days ago. States BP has been around 150/87 on regular checks. Pt says that after taking her atorvastatin her blood pressure was elevated and she was having palpitations.

## 2022-12-10 ENCOUNTER — Ambulatory Visit
Admission: EM | Admit: 2022-12-10 | Discharge: 2022-12-10 | Disposition: A | Discharge status: Home / Self Care | Payer: No Typology Code available for payment source | Attending: Urgent Care | Admitting: Urgent Care

## 2022-12-10 DIAGNOSIS — M25511 Pain in right shoulder: Secondary | ICD-10-CM | POA: Diagnosis not present

## 2022-12-10 DIAGNOSIS — M545 Low back pain, unspecified: Secondary | ICD-10-CM | POA: Diagnosis not present

## 2022-12-10 DIAGNOSIS — I10 Essential (primary) hypertension: Secondary | ICD-10-CM | POA: Diagnosis not present

## 2022-12-10 DIAGNOSIS — M62838 Other muscle spasm: Secondary | ICD-10-CM | POA: Diagnosis not present

## 2022-12-10 MED ORDER — CYCLOBENZAPRINE HCL 5 MG PO TABS: 5.0000 mg | ORAL_TABLET | Freq: Every evening | ORAL | 0 refills | Status: DC | PRN

## 2022-12-10 MED ORDER — IBUPROFEN 600 MG PO TABS: 600.0000 mg | ORAL_TABLET | Freq: Four times a day (QID) | ORAL | 0 refills | Status: DC | PRN
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
## 2022-12-24 LAB — COLOGUARD: COLOGUARD: NEGATIVE

## 2024-10-02 ENCOUNTER — Ambulatory Visit
Admission: EM | Admit: 2024-10-02 | Discharge: 2024-10-02 | Disposition: A | Discharge status: Home / Self Care | Payer: Self-pay | Attending: Family Medicine | Admitting: Family Medicine

## 2024-10-02 ENCOUNTER — Other Ambulatory Visit: Payer: Self-pay

## 2024-10-02 DIAGNOSIS — S46811A Strain of other muscles, fascia and tendons at shoulder and upper arm level, right arm, initial encounter: Secondary | ICD-10-CM

## 2024-10-02 DIAGNOSIS — S161XXA Strain of muscle, fascia and tendon at neck level, initial encounter: Secondary | ICD-10-CM

## 2024-10-02 DIAGNOSIS — S46812A Strain of other muscles, fascia and tendons at shoulder and upper arm level, left arm, initial encounter: Secondary | ICD-10-CM

## 2024-10-02 MED ORDER — METHOCARBAMOL 500 MG PO TABS: 500.0000 mg | ORAL_TABLET | Freq: Every evening | ORAL | 0 refills | Status: AC | PRN

## 2024-10-02 MED ORDER — NAPROXEN 375 MG PO TABS: 375.0000 mg | ORAL_TABLET | Freq: Two times a day (BID) | ORAL | 0 refills | Status: AC | PRN

## 2024-10-02 NOTE — ED Triage Notes (Signed)
 Pt states 3 days ago when she was at work and she injured her right shoulder by lifting a heavy pt. Pt denies numbness or tingling. Pt states she gets int neck pain. Pt c/o right shoulder pain that radiates down right arm. Pt has 2+ right radial pulse, cap refill less than 3 sec, warm to touch, 4/5 right grip strength.

## 2024-10-02 NOTE — ED Provider Notes (Signed)
 [facility]    CSN: [id number] Arrival date & time: 10/02/24  9040      History   Chief Complaint Chief Complaint  Patient presents with   Shoulder Injury    HPI Kendra Webb is a 58 y.o. female presents for shoulder neck strain.  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  She states since then she has been having neck and bilateral posterior shoulder pain that is worse with movement.  No numbness/tingling/weakness of her upper extremities.  No headaches or dizziness.  No history of injuries or surgeries to the area.  She has been taking Tylenol  for symptoms.  She is not sure she will be filing a Worker's Comp. claim at this time.  No other concerns at this time.   Shoulder Injury    Past Medical History:  Diagnosis Date   Hypertension     There are no active problems to display for this patient.   Past Surgical History:  Procedure Laterality Date   CHOLECYSTECTOMY      OB History   No obstetric history on file.      Home Medications    Prior to Admission medications   Medication Sig Start Date End Date Taking? Authorizing Provider  methocarbamol  (ROBAXIN ) 500 MG tablet Take 1 tablet (500 mg total) by mouth at bedtime as needed for muscle spasms. 10/02/24  Yes Danelle Curiale, Jodi R, NP  naproxen  (NAPROSYN ) 375 MG tablet Take 1 tablet (375 mg total) by mouth 2 (two) times daily as needed. 10/02/24  Yes Evan Osburn, Jodi R, NP  amLODipine -benazepril  (LOTREL) 10-20 MG capsule Take 1 capsule by mouth daily. 08/17/17   Geroldine Berg, MD  aspirin 325 MG tablet Take 325 mg by mouth every 6 (six) hours as needed for mild pain, moderate pain or headache.    [provider]  traMADol  (ULTRAM ) 50 MG tablet Take 1 tablet (50 mg total) by mouth every 6 (six) hours as needed. 02/25/16   Kirichenko, Tatyana, PA-C    Family History History  reviewed. No pertinent family history.  Social History Social History   Tobacco Use   Smoking status: Never   Smokeless tobacco: Never  Substance Use Topics   Alcohol use: No   Drug use: No     Allergies   Hydrocodone-acetaminophen , Latex, and Pantoprazole sodium   Review of Systems Review of Systems  Musculoskeletal:  Positive for neck pain.       Bilateral shoulder pain     Physical Exam Triage Vital Signs ED Triage Vitals  Encounter Vitals Group     BP 10/02/24 1058 (!) 144/87     Girls Systolic BP Percentile --      Girls Diastolic BP Percentile --      Boys Systolic BP Percentile --      Boys Diastolic BP Percentile --      Pulse Rate 10/02/24 1058 (!) 51     Resp 10/02/24 1058 17     Temp 10/02/24 1058 98.3 F (36.8 C)     Temp Source 10/02/24 1058 Oral     SpO2 10/02/24 1058 95 %     Weight --      Height --      Head Circumference --      Peak Flow --      Pain Score 10/02/24  1055 8     Pain Loc --      Pain Education --      Exclude from Growth Chart --    No data found.  Updated Vital Signs BP (!) 144/87   Pulse (!) 51   Temp 98.3 F (36.8 C) (Oral)   Resp 17   LMP 04/29/2013   SpO2 95%   Visual Acuity Right Eye Distance:   Left Eye Distance:   Bilateral Distance:    Right Eye Near:   Left Eye Near:    Bilateral Near:     Physical Exam Vitals and nursing note reviewed.  Constitutional:      General: She is not in acute distress.    Appearance: Normal appearance. She is not ill-appearing.  HENT:     Head: Normocephalic and atraumatic.  Eyes:     Pupils: Pupils are equal, round, and reactive to light.  Neck:      Comments: Tender to palpation to bilateral paracervical muscles that extends to bilateral trapezius muscles, right greater than left.  Full range of motion of neck with pain on flexion and right rotation.  Strength is 5 out of 5 bilateral upper extremities. Cardiovascular:     Rate and Rhythm: Bradycardia present.   Pulmonary:     Effort: Pulmonary effort is normal.  Abdominal:     General: Abdomen is flat.  Musculoskeletal:     Cervical back: Neck supple. No edema, erythema, signs of trauma, rigidity or torticollis. Pain with movement and muscular tenderness present. No spinous process tenderness. Normal range of motion.  Skin:    General: Skin is warm and dry.  Neurological:     General: No focal deficit present.     Mental Status: She is alert and oriented to person, place, and time.  Psychiatric:        Mood and Affect: Mood normal.        Behavior: Behavior normal.      UC Treatments / Results  Labs (all labs ordered are listed, but only abnormal results are displayed) Labs Reviewed - No data to display  EKG   Radiology No results found.  Procedures Procedures (including critical care time)  Medications Ordered in UC Medications - No data to display  Initial Impression / Assessment and Plan / UC Course  I have reviewed the triage vital signs and the nursing notes.  Pertinent labs & imaging results that were available during my care of the patient were reviewed by me and considered in my medical decision making (see chart for details).     Reviewed exam and symptoms with patient.  No red flags.  Discussed muscle strain, she declined Toradol injection.  Will do trial of Robaxin  nightly and naproxen  twice daily.  Advise heat rest and follow-up with Worker's Comp. clinic if symptoms do not improve.  ER precautions reviewed. Final Clinical Impressions(s) / UC Diagnoses   Final diagnoses:  Strain of left trapezius muscle, initial encounter  Trapezius muscle strain, right, initial encounter  Strain of neck muscle, initial encounter     Discharge Instructions      You may start Robaxin  nightly as needed.  This is a muscle relaxer and will make you drowsy.  Do not drink alcohol or drive on this medication.  Start naproxen  twice daily as needed for your muscle pain.  Heat to  the area and lots of rest.  Please follow-up with the Worker's Comp. clinic if your symptoms do not improve.  Please go to the ER if you develop any worsening symptoms.  I hope you feel better soon!    ED Prescriptions     Medication Sig Dispense Auth. Provider   methocarbamol  (ROBAXIN ) 500 MG tablet Take 1 tablet (500 mg total) by mouth at bedtime as needed for muscle spasms. 5 tablet Charina Fons, Jodi R, NP   naproxen  (NAPROSYN ) 375 MG tablet Take 1 tablet (375 mg total) by mouth 2 (two) times daily as needed. 14 tablet Sharron Simpson, Jodi R, NP      PDMP not reviewed this encounter.   Loreda Myla SAUNDERS, NP 10/02/24 [phone]

## 2024-10-02 NOTE — Discharge Instructions (Addendum)
 You may start Robaxin  nightly as needed.  This is a muscle relaxer and will make you drowsy.  Do not drink alcohol or drive on this medication.  Start naproxen  twice daily as needed for your muscle pain.  Heat to the area and lots of rest.  Please follow-up with the Worker's Comp. clinic if your symptoms do not improve.  Please go to the ER if you develop any worsening symptoms.  I hope you feel better soon!

## 2024-10-17 ENCOUNTER — Other Ambulatory Visit (HOSPITAL_BASED_OUTPATIENT_CLINIC_OR_DEPARTMENT_OTHER): Payer: Self-pay
